# Patient Record
Sex: Male | Born: 1985 | Hispanic: No | Marital: Single | Smoking: Never smoker
Health system: Southern US, Community
[De-identification: ages and names within clinical notes are randomized; demographics above are authoritative.]

## PROBLEM LIST (undated history)

## (undated) DIAGNOSIS — F988 Other specified behavioral and emotional disorders with onset usually occurring in childhood and adolescence: Secondary | ICD-10-CM

---

## 2012-04-08 ENCOUNTER — Encounter: Payer: Self-pay | Admitting: Physician Assistant

## 2012-04-08 ENCOUNTER — Ambulatory Visit (INDEPENDENT_AMBULATORY_CARE_PROVIDER_SITE_OTHER): Payer: BC Managed Care – PPO | Admitting: Physician Assistant

## 2012-04-08 VITALS — BP 132/90 | HR 90 | Ht 73.0 in | Wt 185.0 lb

## 2012-04-08 DIAGNOSIS — R030 Elevated blood-pressure reading, without diagnosis of hypertension: Secondary | ICD-10-CM

## 2012-04-08 DIAGNOSIS — F909 Attention-deficit hyperactivity disorder, unspecified type: Secondary | ICD-10-CM | POA: Insufficient documentation

## 2012-04-08 DIAGNOSIS — Z13220 Encounter for screening for lipoid disorders: Secondary | ICD-10-CM

## 2012-04-08 DIAGNOSIS — Z131 Encounter for screening for diabetes mellitus: Secondary | ICD-10-CM

## 2012-04-08 MED ORDER — AMPHETAMINE-DEXTROAMPHET ER 20 MG PO CP24: 20.0000 mg | ORAL_CAPSULE | ORAL | Status: DC

## 2012-04-08 NOTE — Patient Instructions (Signed)
Start Adderall XR daily in the morning. Call if any CP, heart palpitations or cardiac problems. May decrease appetite make sure you continue to have a balanced diet. Follow up in 1 month. Will get labs to check cholesterol and kidneys.    Attention Deficit Hyperactivity Disorder Attention deficit hyperactivity disorder (ADHD) is a problem with behavior issues based on the way the brain functions (neurobehavioral disorder). It is a common reason for behavior and academic problems in school. CAUSES  The cause of ADHD is unknown in most cases. It may run in families. It sometimes can be associated with learning disabilities and other behavioral problems. SYMPTOMS  There are 3 types of ADHD. The 3 types and some of the symptoms include:  Inattentive   Gets bored or distracted easily.   Loses or forgets things. Forgets to hand in homework.   Has trouble organizing or completing tasks.   Difficulty staying on task.   An inability to organize daily tasks and school work.   Leaving projects, chores, or homework unfinished.   Trouble paying attention or responding to details. Careless mistakes.   Difficulty following directions. Often seems like is not listening.   Dislikes activities that require sustained attention (like chores or homework).   Hyperactive-impulsive   Feels like it is impossible to sit still or stay in a seat. Fidgeting with hands and feet.   Trouble waiting turn.   Talking too much or out of turn. Interruptive.   Speaks or acts impulsively.   Aggressive, disruptive behavior.   Constantly busy or on the go, noisy.   Combined   Has symptoms of both of the above.  Often children with ADHD feel discouraged about themselves and with school. They often perform well below their abilities in school. These symptoms can cause problems in home, school, and in relationships with peers. As children get older, the excess motor activities can calm down, but the problems  with paying attention and staying organized persist. Most children do not outgrow ADHD but with good treatment can learn to cope with the symptoms. DIAGNOSIS  When ADHD is suspected, the diagnosis should be made by professionals trained in ADHD.  Diagnosis will include:  Ruling out other reasons for the child's behavior.   The caregivers will check with the child's school and check their medical records.   They will talk to teachers and parents.   Behavior rating scales for the child will be filled out by those dealing with the child on a daily basis.  A diagnosis is made only after all information has been considered. TREATMENT  Treatment usually includes behavioral treatment often along with medicines. It may include stimulant medicines. The stimulant medicines decrease impulsivity and hyperactivity and increase attention. Other medicines used include antidepressants and certain blood pressure medicines. Most experts agree that treatment for ADHD should address all aspects of the child's functioning. Treatment should not be limited to the use of medicines alone. Treatment should include structured classroom management. The parents must receive education to address rewarding good behavior, discipline, and limit-setting. Tutoring or behavioral therapy or both should be available for the child. If untreated, the disorder can have long-term serious effects into adolescence and adulthood. HOME CARE INSTRUCTIONS   Often with ADHD there is a lot of frustration among the family in dealing with the illness. There is often blame and anger that is not warranted. This is a life long illness. There is no way to prevent ADHD. In many cases, because the problem affects  the family as a whole, the entire family may need help. A therapist can help the family find better ways to handle the disruptive behaviors and promote change. If the child is young, most of the therapist's work is with the parents. Parents will  learn techniques for coping with and improving their child's behavior. Sometimes only the child with the ADHD needs counseling. Your caregivers can help you make these decisions.   Children with ADHD may need help in organizing. Some helpful tips include:   Keep routines the same every day from wake-up time to bedtime. Schedule everything. This includes homework and playtime. This should include outdoor and indoor recreation. Keep the schedule on the refrigerator or a bulletin board where it is frequently seen. Mark schedule changes as far in advance as possible.   Have a place for everything and keep everything in its place. This includes clothing, backpacks, and school supplies.   Encourage writing down assignments and bringing home needed books.   Offer your child a well-balanced diet. Breakfast is especially important for school performance. Children should avoid drinks with caffeine including:   Soft drinks.   Coffee.   Tea.   However, some older children (adolescents) may find these drinks helpful in improving their attention.   Children with ADHD need consistent rules that they can understand and follow. If rules are followed, give small rewards. Children with ADHD often receive, and expect, criticism. Look for good behavior and praise it. Set realistic goals. Give clear instructions. Look for activities that can foster success and self-esteem. Make time for pleasant activities with your child. Give lots of affection.   Parents are their children's greatest advocates. Learn as much as possible about ADHD. This helps you become a stronger and better advocate for your child. It also helps you educate your child's teachers and instructors if they feel inadequate in these areas. Parent support groups are often helpful. A national group with local chapters is called CHADD (Children and Adults with Attention Deficit Hyperactivity Disorder).  PROGNOSIS  There is no cure for ADHD. Children  with the disorder seldom outgrow it. Many find adaptive ways to accommodate the ADHD as they mature. SEEK MEDICAL CARE IF:  Your child has repeated muscle twitches, cough or speech outbursts.   Your child has sleep problems.   Your child has a marked loss of appetite.   Your child develops depression.   Your child has new or worsening behavioral problems.   Your child develops dizziness.   Your child has a racing heart.   Your child has stomach pains.   Your child develops headaches.  Document Released: 10/18/2002 Document Revised: 10/17/2011 Document Reviewed: 05/30/2008 [facility] Patient Information 2012 [place], [place].

## 2012-04-08 NOTE — Progress Notes (Signed)
  Subjective:    Patient ID: Donald Bonilla, male    DOB: [birth year], 26 y.o.   MRN: [id number]  HPI Patient is a new patient wanting to establish care today. PMH was reviewed. Patient has had a history of ADHD. He brings in childhood records with proof of diagnosis. He has tried to manage his symptoms while in college without medication. He has done ok with this but recently his lack of focus has become unmanagable. He states that it is hard for him to focus more than at a time. He currently has to hold down a job and going to school. In the past he had been able to run an hour or more daily. Because of job and school he has not been able to run as much and thinks that might be making it worse. He denies CP, palpitations, SOB, depression. He has never had elevated bp before. He does report to be anxious today about being at the doctor.      Review of Systems     Objective:   Physical Exam  Constitutional: He is oriented to person, place, and time. He appears well-developed and well-nourished.  HENT:  Head: Normocephalic and atraumatic.  Cardiovascular: Normal rate, regular rhythm, normal heart sounds and intact distal pulses.   Pulmonary/Chest: Effort normal and breath sounds normal. He has no wheezes.  Neurological: He is alert and oriented to person, place, and time.  Skin: Skin is warm and dry.  Psychiatric:       Pacing back and forth when I walked into the room. Hyperverbal throughout encounter. Seems to be very anxious today.          Assessment & Plan:  ADHD- inattentive 27/27 hyperactivity/impulse 27/27 Adderall XR 20mg  was started daily. Educated patient about side effects and told to not tell people that he takes this drug. Call office if starts to have any side effects such as palpitations. Will follow up in 1 month.   Elevated blood pressure- has not been to doctor in many years and does not know of any prior blood pressure problems. Recheck and was 132/90. Patient  reports bad diet full of salt. He used to exercise daily with running since starting a job and school he has not been exercising very regularly. He does report that is going to start making better diet choices and limit salty foods. He also wants to try to exercise on more of a regular basis. Will recheck in 1 month.   Ordered labs today for screening purposes and to check kidney function due to bp being elevated.

## 2012-05-08 ENCOUNTER — Ambulatory Visit: Payer: BC Managed Care – PPO | Admitting: Physician Assistant

## 2013-08-23 ENCOUNTER — Ambulatory Visit: Payer: BC Managed Care – PPO | Admitting: Physician Assistant

## 2013-08-27 ENCOUNTER — Encounter: Payer: Self-pay | Admitting: Physician Assistant

## 2013-08-27 ENCOUNTER — Ambulatory Visit (INDEPENDENT_AMBULATORY_CARE_PROVIDER_SITE_OTHER): Payer: BC Managed Care – PPO | Admitting: Physician Assistant

## 2013-08-27 VITALS — BP 138/85 | HR 90 | Wt 172.0 lb

## 2013-08-27 DIAGNOSIS — F39 Unspecified mood [affective] disorder: Secondary | ICD-10-CM

## 2013-08-27 DIAGNOSIS — F411 Generalized anxiety disorder: Secondary | ICD-10-CM

## 2013-08-27 DIAGNOSIS — Z13220 Encounter for screening for lipoid disorders: Secondary | ICD-10-CM

## 2013-08-27 DIAGNOSIS — F329 Major depressive disorder, single episode, unspecified: Secondary | ICD-10-CM

## 2013-08-27 DIAGNOSIS — Z131 Encounter for screening for diabetes mellitus: Secondary | ICD-10-CM

## 2013-08-27 MED ORDER — FLUOXETINE HCL 20 MG PO TABS: 20.0000 mg | ORAL_TABLET | Freq: Every day | ORAL | Status: DC

## 2013-08-27 NOTE — Progress Notes (Signed)
  Subjective:    Patient ID: Donald Bonilla, male    DOB: 28-Aug-1986, 28 y.o.   MRN: [id number]  HPI Patient presents to the clinic to discuss ongoing anxiety and depression. He has previously been diagnosed with ADHD. He stopped medication because he felt like it was making his heart race and maybe making anxiety symptoms worse. He feels like his anxiety and sometimes depression is starting to become a problem. He recently broke up with his girlfriend and feels like it was a result of his up and down mood. His parents do not think he has a problem and do not want him to start medication. There is no diagnosed mental disorders in his family but he feels like his dad has similar problems just don't want to admit it. Situationly he has everything anyone would want money, cars, good job. He is still very unhappy. He denies any thoughts of harming himself or others. He does "live on the edge" sometimes driving 409 mph, feeling invincible then can go down and feel like a failure. He just lives day to day feeling 'overwhelmed', but he has nothing to feel overwhelmed about. He is doing well at work. He admits to spending a lot of money but he has it. Has problem going to sleep but then can sleep for 12 hours straight No pleasure in doing things. Working out does make symptoms better. He has never tried medications.    Review of Systems     Objective:   Physical Exam  Constitutional: He is oriented to person, place, and time. He appears well-developed and well-nourished.  HENT:  Head: Normocephalic and atraumatic.  Cardiovascular: Normal rate, regular rhythm and normal heart sounds.   Pulmonary/Chest: Effort normal and breath sounds normal.  Neurological: He is alert and oriented to person, place, and time.  Skin: Skin is warm and dry.  Psychiatric: Judgment and thought content normal.  Hyperverbal/anxious.          Assessment & Plan:  Mood disorder/depression/anxiety- GAD-7 was 20 and PHQ-9 was 17.  Discussed with pt he is showing a lot of signs of biploar disorder. I would like to start depakote; however, pt had never tried medication and had done research and wanted to start with SSRI. He also does not want to gain weight. I told him we could start with that but he is most likely going to need a mood stablizer. I offered a pysch referral. Pt declined and requested that he stay with me right now because he felt comfortable with me. I said that I could try a few things but he may need to talk with specialist at some point. Encouraged exercise regularly. Started prozac 20mg  daily. Likely will need to increase. Follow up in 6 weeks. Will check thyroid just to make sure not elevated.  Will get some screening labs.    Spent 30 minutes with patient and greater than 50 percent of visit spent counseling patient reguarding bipolar, depression, anxiety.

## 2013-08-27 NOTE — Patient Instructions (Signed)
Melatonin up to 10mg  at night. 1 hour before bedtime.

## 2013-08-28 LAB — LIPID PANEL
Cholesterol: 208 mg/dL — ABNORMAL HIGH (ref 0–200)
Triglycerides: 39 mg/dL (ref <150)
VLDL: 8 mg/dL (ref 0–40)

## 2013-08-28 LAB — TSH: TSH: 1.405 u[IU]/mL (ref 0.350–4.500)

## 2013-08-28 LAB — COMPLETE METABOLIC PANEL WITH GFR
AST: 18 U/L (ref 0–37)
Albumin: 4.9 g/dL (ref 3.5–5.2)
BUN: 11 mg/dL (ref 6–23)
Calcium: 9.7 mg/dL (ref 8.4–10.5)
Chloride: 102 mEq/L (ref 96–112)
Creat: 0.92 mg/dL (ref 0.50–1.35)
GFR, Est African American: >89 mL/min
GFR, Est Non African American: >89 mL/min
Glucose, Bld: 84 mg/dL (ref 70–99)
Potassium: 4.5 mEq/L (ref 3.5–5.3)

## 2013-08-29 DIAGNOSIS — F411 Generalized anxiety disorder: Secondary | ICD-10-CM | POA: Insufficient documentation

## 2013-08-29 DIAGNOSIS — F39 Unspecified mood [affective] disorder: Secondary | ICD-10-CM | POA: Insufficient documentation

## 2013-08-29 DIAGNOSIS — F329 Major depressive disorder, single episode, unspecified: Secondary | ICD-10-CM | POA: Insufficient documentation

## 2013-08-29 DIAGNOSIS — Z13220 Encounter for screening for lipoid disorders: Secondary | ICD-10-CM | POA: Insufficient documentation

## 2013-08-29 DIAGNOSIS — F32A Depression, unspecified: Secondary | ICD-10-CM | POA: Insufficient documentation

## 2013-10-11 ENCOUNTER — Ambulatory Visit: Payer: BC Managed Care – PPO | Admitting: Physician Assistant

## 2013-10-11 DIAGNOSIS — Z0289 Encounter for other administrative examinations: Secondary | ICD-10-CM

## 2018-07-22 ENCOUNTER — Emergency Department (HOSPITAL_COMMUNITY)
Admission: EM | Admit: 2018-07-22 | Discharge: 2018-07-22 | Disposition: A | Discharge status: Home / Self Care | Payer: PRIVATE HEALTH INSURANCE | Attending: Emergency Medicine | Admitting: Emergency Medicine

## 2018-07-22 ENCOUNTER — Emergency Department (HOSPITAL_COMMUNITY): Payer: PRIVATE HEALTH INSURANCE

## 2018-07-22 ENCOUNTER — Encounter (HOSPITAL_COMMUNITY): Payer: Self-pay

## 2018-07-22 ENCOUNTER — Other Ambulatory Visit: Payer: Self-pay

## 2018-07-22 DIAGNOSIS — Q438 Other specified congenital malformations of intestine: Secondary | ICD-10-CM | POA: Diagnosis not present

## 2018-07-22 DIAGNOSIS — R1032 Left lower quadrant pain: Secondary | ICD-10-CM | POA: Diagnosis present

## 2018-07-22 DIAGNOSIS — K6389 Other specified diseases of intestine: Secondary | ICD-10-CM

## 2018-07-22 DIAGNOSIS — K529 Noninfective gastroenteritis and colitis, unspecified: Secondary | ICD-10-CM

## 2018-07-22 LAB — CBC
HCT: 45.9 % (ref 39.0–52.0)
Hemoglobin: 15.4 g/dL (ref 13.0–17.0)
MCH: 29.6 pg (ref 26.0–34.0)
MCHC: 33.6 g/dL (ref 30.0–36.0)
MCV: 88.3 fL (ref 78.0–100.0)
Platelets: 266 10*3/uL (ref 150–400)
RBC: 5.2 MIL/uL (ref 4.22–5.81)
RDW: 12.7 % (ref 11.5–15.5)
WBC: 9 10*3/uL (ref 4.0–10.5)

## 2018-07-22 LAB — COMPREHENSIVE METABOLIC PANEL
ALT: 33 U/L (ref 0–44)
AST: 24 U/L (ref 15–41)
Albumin: 4.5 g/dL (ref 3.5–5.0)
Alkaline Phosphatase: 66 U/L (ref 38–126)
Anion gap: 9 (ref 5–15)
BUN: 17 mg/dL (ref 6–20)
CO2: 28 mmol/L (ref 22–32)
Calcium: 9.7 mg/dL (ref 8.9–10.3)
Chloride: 103 mmol/L (ref 98–111)
Creatinine, Ser: 0.92 mg/dL (ref 0.61–1.24)
GFR calc Af Amer: >60 mL/min (ref >60)
GFR calc non Af Amer: >60 mL/min (ref >60)
Glucose, Bld: 101 mg/dL — ABNORMAL HIGH (ref 70–99)
Potassium: 4.3 mmol/L (ref 3.5–5.1)
Sodium: 140 mmol/L (ref 135–145)
Total Bilirubin: 0.5 mg/dL (ref 0.3–1.2)
Total Protein: 8.1 g/dL (ref 6.5–8.1)

## 2018-07-22 LAB — URINALYSIS, ROUTINE W REFLEX MICROSCOPIC
Bilirubin Urine: NEGATIVE
Glucose, UA: NEGATIVE mg/dL
Hgb urine dipstick: NEGATIVE
Ketones, ur: NEGATIVE mg/dL
Leukocytes, UA: NEGATIVE
Nitrite: NEGATIVE
Protein, ur: NEGATIVE mg/dL
Specific Gravity, Urine: 1.009 (ref 1.005–1.030)
pH: 6 (ref 5.0–8.0)

## 2018-07-22 LAB — LIPASE, BLOOD: Lipase: 52 U/L — ABNORMAL HIGH (ref 11–51)

## 2018-07-22 MED ORDER — IOPAMIDOL (ISOVUE-300) INJECTION 61%
INTRAVENOUS | Status: AC
  Filled 2018-07-22: qty 100

## 2018-07-22 MED ORDER — LACTATED RINGERS IV BOLUS
1000.0000 mL | Freq: Once | INTRAVENOUS | Status: AC
  Administered 2018-07-22: 1000 mL via INTRAVENOUS

## 2018-07-22 MED ORDER — IOPAMIDOL (ISOVUE-300) INJECTION 61%
100.0000 mL | Freq: Once | INTRAVENOUS | Status: AC | PRN
  Administered 2018-07-22: 100 mL via INTRAVENOUS

## 2018-07-22 NOTE — ED Provider Notes (Signed)
[facility]-EMERGENCY DEPT Provider Note   CSN: [id number] Arrival date & time: 07/22/18  1027     History   Chief Complaint Chief Complaint  Patient presents with  . Abdominal Pain    HPI Donald Bonilla is a 32 y.o. male.  HPI   32 year old male with abdominal pain.  Gradual onset about 3 days ago.  Pain has been persistently in the left lower quadrant and doesn't radiate.  Initially mild then intensified over about 24 hours and has been relatively stable since then.  Worse with certain movements.  Has a bloated sensation.  Mild nausea.  No vomiting.  No change in his bowel movements.  No urinary complaints.  No history of similar symptoms.  He has had kidney stones previously but states that this feels different.  Denies any past history of any abdominal surgeries.  Past Medical History:  Diagnosis Date  . Asthma    childhood    Patient Active Problem List   Diagnosis Date Noted  . Generalized anxiety disorder 08/29/2013  . Screening for lipoid disorders 08/29/2013  . Depression 08/29/2013  . Mood disorder (HCC) 08/29/2013  . ADHD (attention deficit hyperactivity disorder) 04/08/2012    History reviewed. No pertinent surgical history.      Home Medications    Prior to Admission medications   Medication Sig Start Date End Date Taking? Authorizing Provider  amphetamine-dextroamphetamine (ADDERALL) 20 MG tablet Take 20 mg by mouth 2 (two) times daily. 07/20/18  Yes [provider]  zaleplon (SONATA) 10 MG capsule Take 10 mg by mouth at bedtime as needed for sleep.  04/17/18  Yes [provider]    Family History Family History  Problem Relation Age of Onset  . Cancer Mother        skin cancer  . Hypertension Maternal Grandmother   . Cancer Maternal Grandmother        breast cancer  . Hyperlipidemia Maternal Grandmother   . Diabetes Maternal Grandfather   . Stroke Paternal Grandfather   . Alcohol abuse Paternal Grandfather       Social History Social History   Tobacco Use  . Smoking status: Never Smoker  Substance Use Topics  . Alcohol use: Yes    Alcohol/week: 7.0 standard drinks    Types: 7 Cans of beer per week  . Drug use: No     Allergies   Patient has no known allergies.   Review of Systems Review of Systems  All systems reviewed and negative, other than as noted in HPI.  Physical Exam Updated Vital Signs BP (!) 133/91 (BP Location: Left Arm)   Pulse 84   Temp 98.6 F (37 C) (Oral)   Resp 18   Ht 6\' 1"  (1.854 m)   Wt 86.2 kg   SpO2 100%   BMI 25.07 kg/m   Physical Exam  Constitutional: He appears well-developed and well-nourished. No distress.  HENT:  Head: Normocephalic and atraumatic.  Eyes: Conjunctivae are normal. Right eye exhibits no discharge. Left eye exhibits no discharge.  Neck: Neck supple.  Cardiovascular: Normal rate, regular rhythm and normal heart sounds. Exam reveals no gallop and no friction rub.  No murmur heard. Pulmonary/Chest: Effort normal and breath sounds normal. No respiratory distress.  Abdominal: Soft. He exhibits no distension. There is tenderness in the left lower quadrant. There is no rigidity, no rebound, no guarding and no CVA tenderness.  Musculoskeletal: He exhibits no edema or tenderness.  Neurological: He is alert.  Skin:  Skin is warm and dry.  Psychiatric: He has a normal mood and affect. His behavior is normal. Thought content normal.  Nursing note and vitals reviewed.    ED Treatments / Results  Labs (all labs ordered are listed, but only abnormal results are displayed) Labs Reviewed  LIPASE, BLOOD - Abnormal; Notable for the following components:      Result Value   Lipase 52 (*)    All other components within normal limits  COMPREHENSIVE METABOLIC PANEL - Abnormal; Notable for the following components:   Glucose, Bld 101 (*)    All other components within normal limits  URINALYSIS, ROUTINE W REFLEX MICROSCOPIC - Abnormal;  Notable for the following components:   Color, Urine STRAW (*)    All other components within normal limits  CBC    EKG None  Radiology Ct Abdomen Pelvis W Contrast  Result Date: 07/22/2018 CLINICAL DATA:  Bloating, left lower quadrant pain. EXAM: CT ABDOMEN AND PELVIS WITH CONTRAST TECHNIQUE: Multidetector CT imaging of the abdomen and pelvis was performed using the standard protocol following bolus administration of intravenous contrast. CONTRAST:  ISOVUE-300 IOPAMIDOL (ISOVUE-300) INJECTION 61% COMPARISON:  None. FINDINGS: Lower chest: Lung bases are clear. No effusions. Heart is normal size. Hepatobiliary: No focal hepatic abnormality. Gallbladder unremarkable. Pancreas: No focal abnormality or ductal dilatation. Spleen: No focal abnormality.  Normal size. Adrenals/Urinary Tract: No adrenal abnormality. No focal renal abnormality. No stones or hydronephrosis. Urinary bladder is unremarkable. Stomach/Bowel: There is inflammatory stranding anterior to the proximal sigmoid colon and distal descending colon. The underlying bowel wall appears normal and I see no diverticula. I favor this represents epiploic appendagitis. Stomach, large and small bowel grossly unremarkable. Appendix is normal. Vascular/Lymphatic: No evidence of aneurysm or adenopathy. Reproductive: No visible focal abnormality. Other: No free fluid or free air. Musculoskeletal: No acute bony abnormality. IMPRESSION: Stranding noted within the fat anterior to the distal descending colon and proximal sigmoid colon without underlying wall abnormality. Favor epiploic appendagitis. Normal appendix. Electronically Signed   By: Charlett Nose M.D.   On: 07/22/2018 12:03    Procedures Procedures (including critical care time)  Medications Ordered in ED Medications  iopamidol (ISOVUE-300) 61 % injection (has no administration in time range)  lactated ringers bolus 1,000 mL (1,000 mLs Intravenous New Bag/Given 07/22/18 1120)  iopamidol  (ISOVUE-300) 61 % injection 100 mL (100 mLs Intravenous Contrast Given 07/22/18 1144)     Initial Impression / Assessment and Plan / ED Course  I have reviewed the triage vital signs and the nursing notes.  Pertinent labs & imaging results that were available during my care of the patient were reviewed by me and considered in my medical decision making (see chart for details).     32year-old male with left lower quadrant pain.  CT showing what may be epiploic appendagitis.  Labs/urinalysis fairly unremarkable.  He is afebrile.  No peritonitis.  Plan symptomatic treatment and expectant management.  This should be a self-limited process.  Return precautions were discussed though.  As needed NSAIDs.  I have reviewed the triage vital signs and the nursing notes. Prior records were reviewed for additional information.    Pertinent labs & imaging results that were available during my care of the patient were reviewed by me and considered in my medical decision making (see chart for details).   Final Clinical Impressions(s) / ED Diagnoses   Final diagnoses:  Epiploic appendagitis    ED Discharge Orders    None  Raeford Razor, MD 07/22/18 1230

## 2018-07-22 NOTE — ED Triage Notes (Signed)
Pt states that he has been bloated and having LLQ pain for several days. Pt states he also became nauseated today.

## 2018-07-22 NOTE — Discharge Instructions (Addendum)
Based on your CT scan, you likely have epiploic appendagitis. Epiploic appendices are finger like fat filled sacs primarily along your colon. They can twist or thrombose (clot) and when this happens it can hurt. It is generally not a serious condition and symptoms usually resolve within a few days up to a couple weeks. Take ibuprofen 600 mg every 6 hours as needed for pain. Return for evaluation for worsening pain, fever, vomiting or if you symptoms do not begin to feel better after about a week.

## 2020-01-29 IMAGING — CT CT ABD-PELV W/ CM
2 of 4 series · 16 of 46 positions shown, 18 images · IV contrast (ISOVUE)
Comparison: None.

CLINICAL DATA: Bloating, left lower quadrant pain.

EXAM:
CT ABDOMEN AND PELVIS WITH CONTRAST
TECHNIQUE: Multidetector CT imaging of the abdomen and pelvis was performed
using the standard protocol following bolus administration of
intravenous contrast.
CONTRAST:  100mL CA4AEK-1YY IOPAMIDOL (CA4AEK-1YY) INJECTION 61%

[Series 2: axial st · axial · 0.81mm/px · z∈[-562,-107]mm · 13 of 103 slices shown, 15 images]
[im 6/103  soft-tissue]
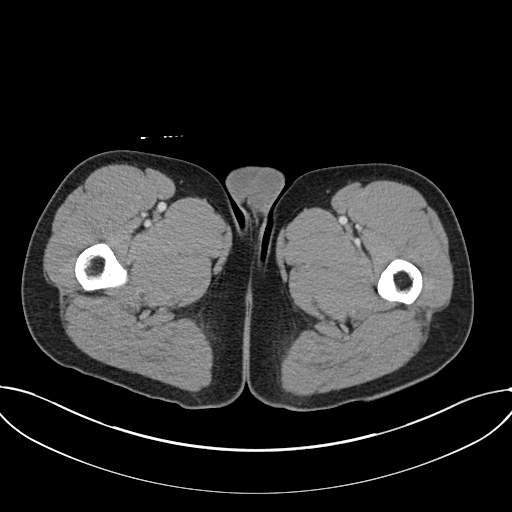
[im 6/103  bone]
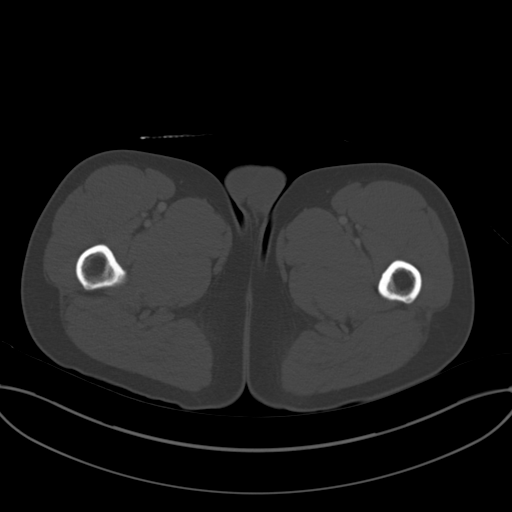
[im 16/103  soft-tissue]
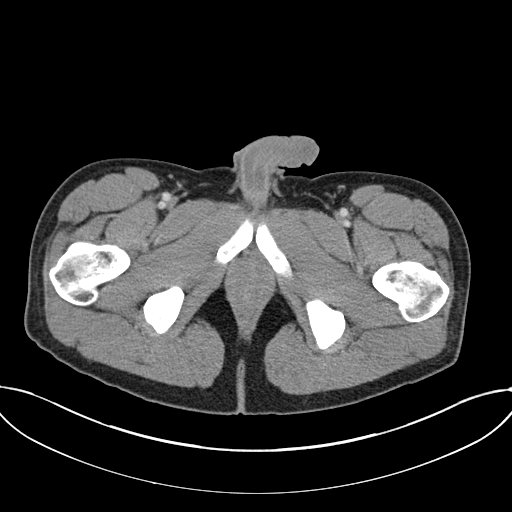
[im 21/103  soft-tissue]
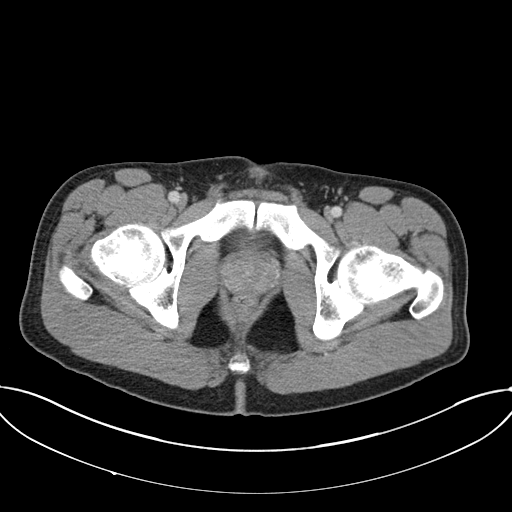
[im 31/103  soft-tissue]
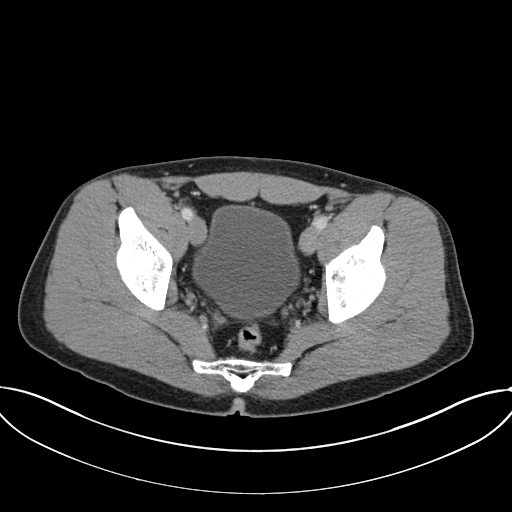
[im 36/103  soft-tissue]
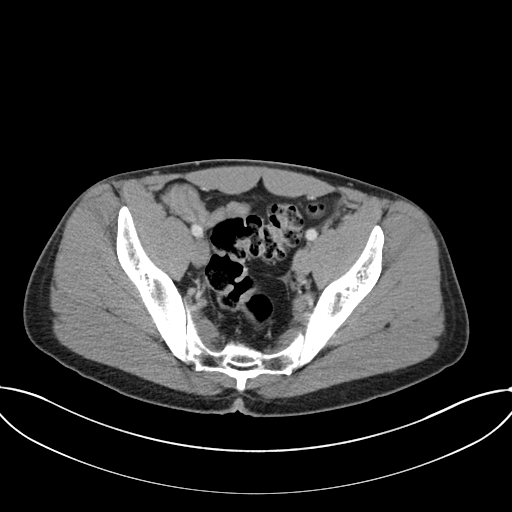
[im 46/103  soft-tissue]
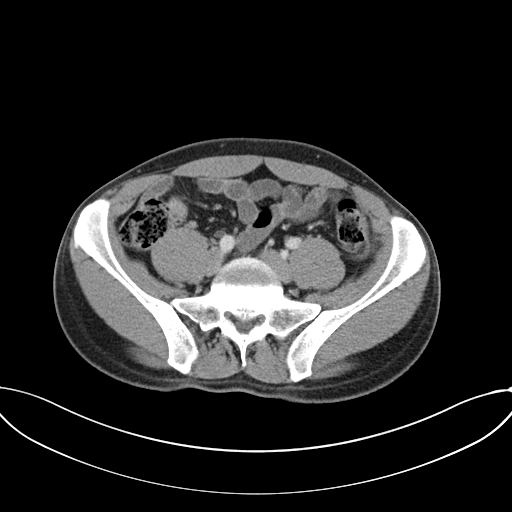
[im 52/103  soft-tissue]
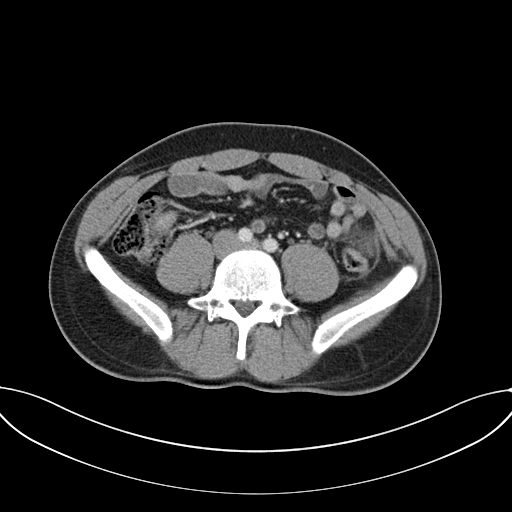
[im 57/103  soft-tissue]
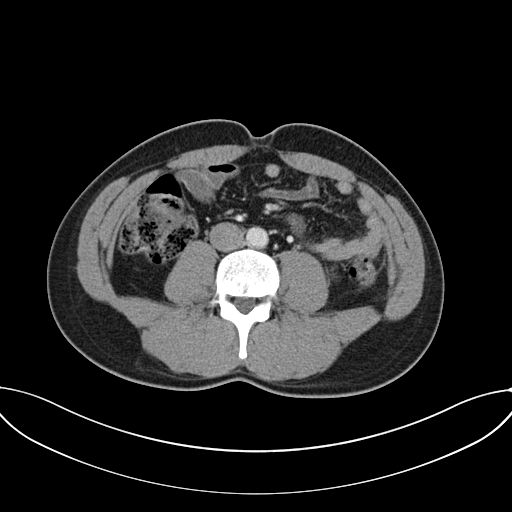
[im 67/103  soft-tissue]
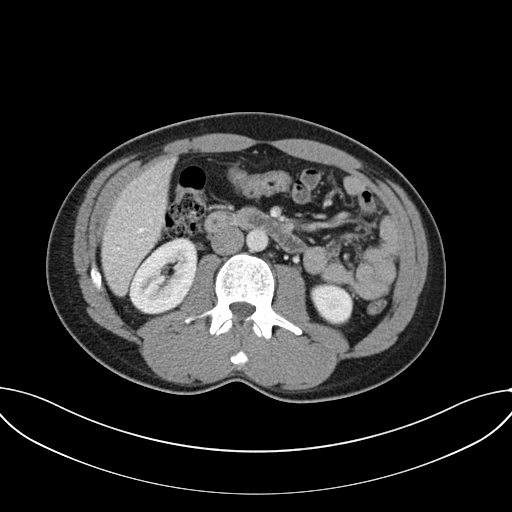
[im 67/103  bone]
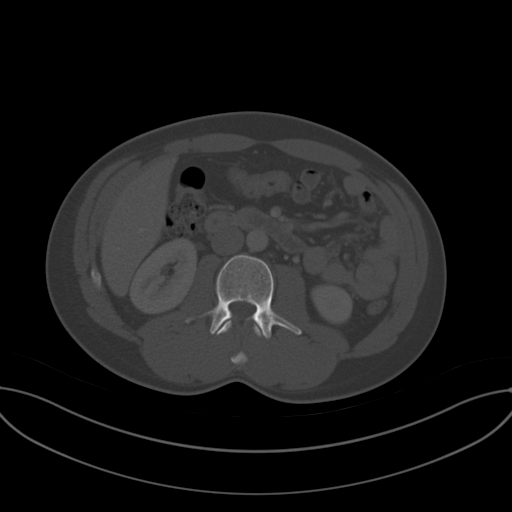
[im 72/103  soft-tissue]
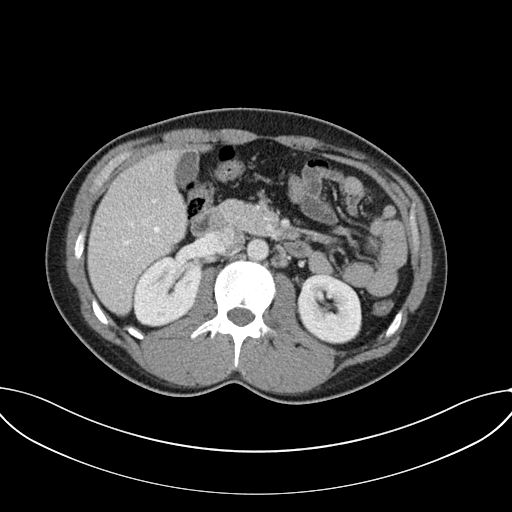
[im 82/103  soft-tissue]
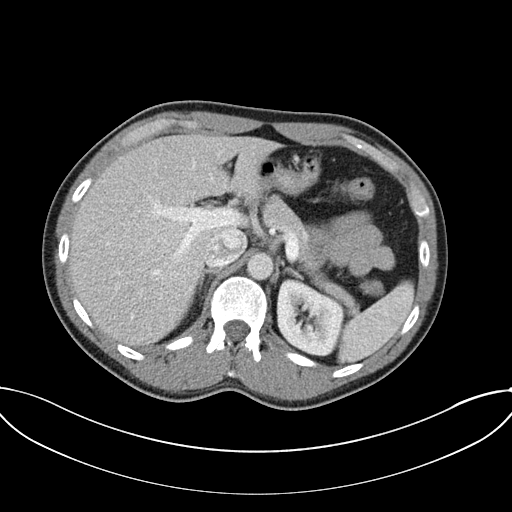
[im 87/103  soft-tissue]
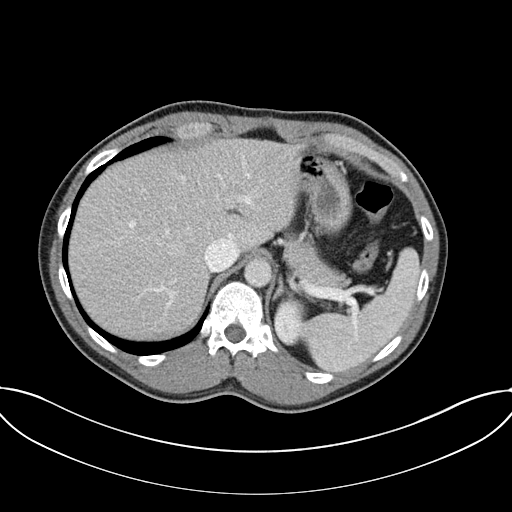
[im 97/103  soft-tissue]
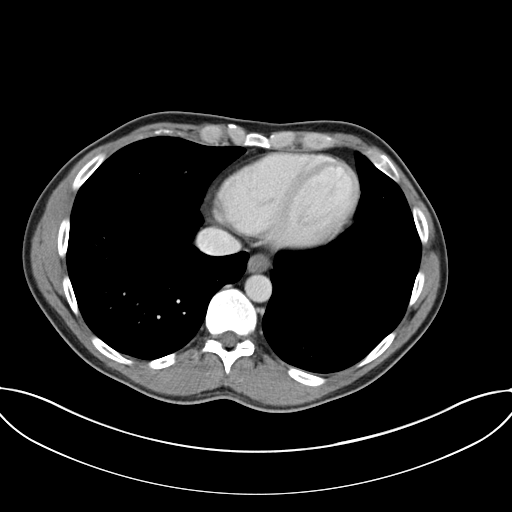

[Series 5: coronal st · coronal · 0.74mm/px · 3 of 88 slices shown]
[im 30/88  soft-tissue]
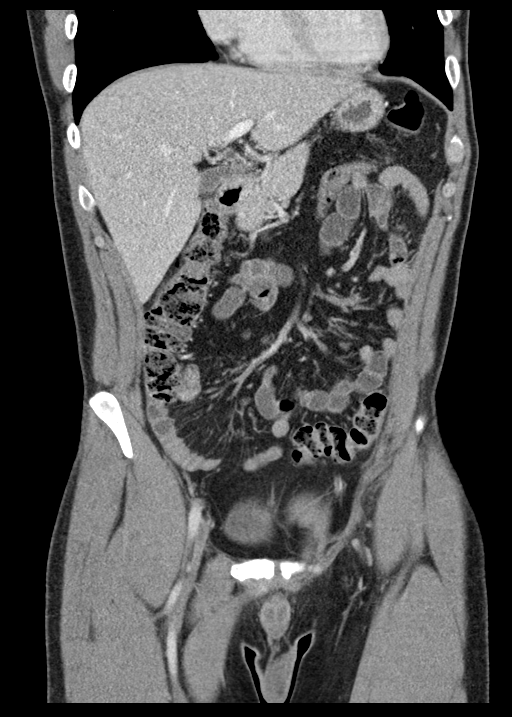
[im 39/88  soft-tissue]
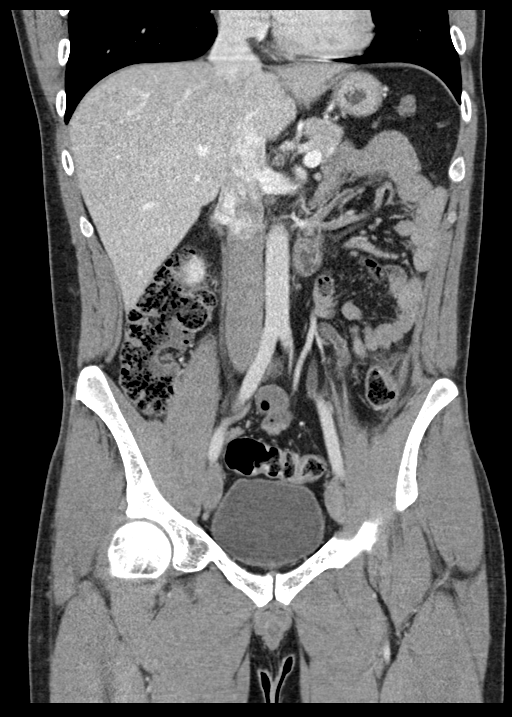
[im 49/88  soft-tissue]
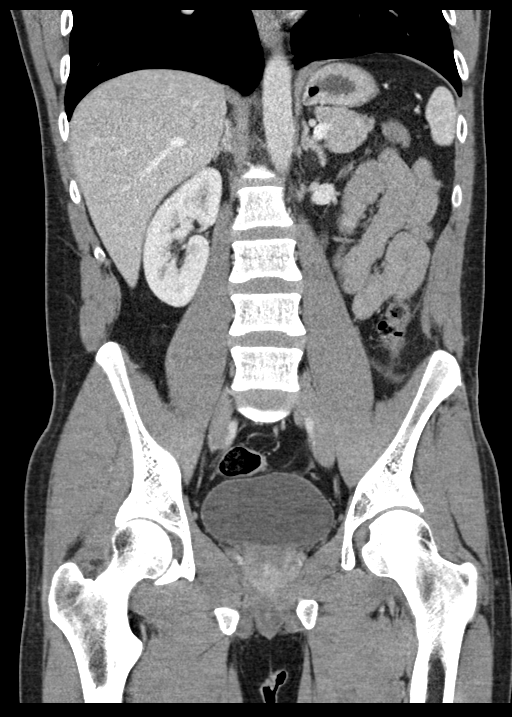

[16 of 46 positions shown; findings below may reference images not displayed]

FINDINGS: Lower chest: Lung bases are clear. No effusions. Heart is normal
size.

Hepatobiliary: No focal hepatic abnormality. Gallbladder
unremarkable.

Pancreas: No focal abnormality or ductal dilatation.

Spleen: No focal abnormality.  Normal size.

Adrenals/Urinary Tract: No adrenal abnormality. No focal renal
abnormality. No stones or hydronephrosis. Urinary bladder is
unremarkable.

Stomach/Bowel: There is inflammatory stranding anterior to the
proximal sigmoid colon and distal descending colon. The underlying
bowel wall appears normal and I see no diverticula. I favor this
represents epiploic appendagitis. Stomach, large and small bowel
grossly unremarkable. Appendix is normal.

Vascular/Lymphatic: No evidence of aneurysm or adenopathy.

Reproductive: No visible focal abnormality.

Other: No free fluid or free air.

Musculoskeletal: No acute bony abnormality.
IMPRESSION: Stranding noted within the fat anterior to the distal descending
colon and proximal sigmoid colon without underlying wall
abnormality. Favor epiploic appendagitis.

Normal appendix.

## 2020-07-15 ENCOUNTER — Emergency Department
Admission: EM | Admit: 2020-07-15 | Discharge: 2020-07-15 | Disposition: A | Discharge status: Home / Self Care | Payer: BLUE CROSS/BLUE SHIELD | Source: Home / Self Care

## 2020-07-15 ENCOUNTER — Encounter: Payer: Self-pay | Admitting: Emergency Medicine

## 2020-07-15 ENCOUNTER — Other Ambulatory Visit: Payer: Self-pay

## 2020-07-15 DIAGNOSIS — R52 Pain, unspecified: Secondary | ICD-10-CM

## 2020-07-15 DIAGNOSIS — R6883 Chills (without fever): Secondary | ICD-10-CM

## 2020-07-15 HISTORY — DX: Other specified behavioral and emotional disorders with onset usually occurring in childhood and adolescence: F98.8

## 2020-07-15 NOTE — Discharge Instructions (Signed)
  You may take 500mg acetaminophen every 4-6 hours or in combination with ibuprofen 400-600mg every 6-8 hours as needed for pain, inflammation, and fever.  Be sure to well hydrated with clear liquids and get at least 8 hours of sleep at night, preferably more while sick.   Please follow up with family medicine in 1 week if needed.   

## 2020-07-15 NOTE — ED Provider Notes (Signed)
[facility]    CSN: [id number] Arrival date & time: 07/15/20  1200      History   Chief Complaint Chief Complaint  Patient presents with  . Chills  . Generalized Body Aches    HPI Donald Bonilla is a 34 y.o. male.   HPI  Donald Bonilla is a 34 y.o. male presenting to UC with c/o generalized body aches and chills with mild headache since receiving his second Pfizer COVID vaccine 5 days ago.  He has not taken anything for his symptoms. His boss recommended he be tested for COVID due to the prolonged symptoms. Denies known sick contacts. Pt works as a Investment banker, operational.  Denies fever, n/v/d. No chest pain or SOB. No cough or congestion.    Past Medical History:  Diagnosis Date  . ADD (attention deficit disorder)   . Asthma    childhood    Patient Active Problem List   Diagnosis Date Noted  . Generalized anxiety disorder 08/29/2013  . Screening for lipoid disorders 08/29/2013  . Depression 08/29/2013  . Mood disorder (HCC) 08/29/2013  . ADHD (attention deficit hyperactivity disorder) 04/08/2012    History reviewed. No pertinent surgical history.     Home Medications    Prior to Admission medications   Medication Sig Start Date End Date Taking? Authorizing Provider  amphetamine-dextroamphetamine (ADDERALL) 20 MG tablet Take 20 mg by mouth 2 (two) times daily. 07/20/18   [provider]  zaleplon (SONATA) 10 MG capsule Take 10 mg by mouth at bedtime as needed for sleep.  04/17/18   [provider]    Family History Family History  Problem Relation Age of Onset  . Cancer Mother        skin cancer  . Hypertension Maternal Grandmother   . Cancer Maternal Grandmother        breast cancer  . Hyperlipidemia Maternal Grandmother   . Diabetes Maternal Grandfather   . Stroke Paternal Grandfather   . Alcohol abuse Paternal Grandfather     Social History Social History   Tobacco Use  . Smoking status: Never Smoker  Substance Use Topics  . Alcohol use:  Yes    Alcohol/week: 7.0 standard drinks    Types: 7 Cans of beer per week  . Drug use: No     Allergies   Patient has no known allergies.   Review of Systems Review of Systems  Constitutional: Positive for chills. Negative for fever.  HENT: Negative for congestion, ear pain, sore throat, trouble swallowing and voice change.   Respiratory: Negative for cough and shortness of breath.   Cardiovascular: Negative for chest pain and palpitations.  Gastrointestinal: Negative for abdominal pain, diarrhea, nausea and vomiting.  Musculoskeletal: Positive for arthralgias, back pain and myalgias.  Skin: Negative for rash.  Neurological: Positive for headaches. Negative for dizziness and light-headedness.  All other systems reviewed and are negative.    Physical Exam Triage Vital Signs ED Triage Vitals  Enc Vitals Group     BP 07/15/20 1307 118/78     Pulse Rate 07/15/20 1307 88     Resp 07/15/20 1307 16     Temp 07/15/20 1307 97.9 F (36.6 C)     Temp Source 07/15/20 1307 Oral     SpO2 07/15/20 1307 98 %     Weight 07/15/20 1308 190 lb (86.2 kg)     Height 07/15/20 1308 6\' 1"  (1.854 m)     Head Circumference --      Peak Flow --  Pain Score 07/15/20 1308 2     Pain Loc --      Pain Edu? --      Excl. in GC? --    No data found.  Updated Vital Signs BP 118/78 (BP Location: Right Arm)   Pulse 88   Temp 97.9 F (36.6 C) (Oral)   Resp 16   Ht 6\' 1"  (1.854 m)   Wt 190 lb (86.2 kg)   SpO2 98%   BMI 25.07 kg/m   Visual Acuity Right Eye Distance:   Left Eye Distance:   Bilateral Distance:    Right Eye Near:   Left Eye Near:    Bilateral Near:     Physical Exam Vitals and nursing note reviewed.  Constitutional:      General: He is not in acute distress.    Appearance: Normal appearance. He is well-developed. He is not ill-appearing, toxic-appearing or diaphoretic.  HENT:     Head: Normocephalic and atraumatic.     Right Ear: Tympanic membrane and ear canal  normal.     Left Ear: Tympanic membrane and ear canal normal.     Nose: Nose normal.     Right Sinus: No maxillary sinus tenderness or frontal sinus tenderness.     Left Sinus: No maxillary sinus tenderness or frontal sinus tenderness.     Mouth/Throat:     Lips: Pink.     Mouth: Mucous membranes are moist.     Pharynx: Oropharynx is clear. Uvula midline.  Eyes:     Extraocular Movements: Extraocular movements intact.     Conjunctiva/sclera: Conjunctivae normal.  Cardiovascular:     Rate and Rhythm: Normal rate and regular rhythm.  Pulmonary:     Effort: Pulmonary effort is normal. No respiratory distress.     Breath sounds: Normal breath sounds. No stridor. No wheezing, rhonchi or rales.  Musculoskeletal:        General: Normal range of motion.     Cervical back: Normal range of motion and neck supple. No tenderness.  Lymphadenopathy:     Cervical: No cervical adenopathy.  Skin:    General: Skin is warm and dry.     Capillary Refill: Capillary refill takes less than 2 seconds.  Neurological:     General: No focal deficit present.     Mental Status: He is alert and oriented to person, place, and time.  Psychiatric:        Behavior: Behavior normal.      UC Treatments / Results  Labs (all labs ordered are listed, but only abnormal results are displayed) Labs Reviewed  SARS-COV-2 RNA,(COVID-19) QUALITATIVE NAAT    EKG   Radiology No results found.  Procedures Procedures (including critical care time)  Medications Ordered in UC Medications - No data to display  Initial Impression / Assessment and Plan / UC Course  I have reviewed the triage vital signs and the nursing notes.  Pertinent labs & imaging results that were available during my care of the patient were reviewed by me and considered in my medical decision making (see chart for details).     Pt appears well, NAD COVID test pending F/u with PCP later this week if needed AVS given  Final Clinical  Impressions(s) / UC Diagnoses   Final diagnoses:  Body aches  Chills     Discharge Instructions     You may take 500mg  acetaminophen every 4-6 hours or in combination with ibuprofen 400-600mg  every 6-8 hours as needed for pain, inflammation,  and fever.  Be sure to well hydrated with clear liquids and get at least 8 hours of sleep at night, preferably more while sick.   Please follow up with family medicine in 1 week if needed.     ED Prescriptions    None     PDMP not reviewed this encounter.   Lurene Shadow, [place] 07/15/20 1415

## 2020-07-15 NOTE — ED Triage Notes (Signed)
Patient here for covid test for work; he had 2nd of covid vaccination doses 07/10/20; since then he has had chills and body aches. Work does not accept this as post vaccination SEs.

## 2020-07-16 LAB — SARS-COV-2 RNA,(COVID-19) QUALITATIVE NAAT: SARS CoV2 RNA: NOT DETECTED

## 2023-11-20 ENCOUNTER — Other Ambulatory Visit (HOSPITAL_COMMUNITY): Payer: Self-pay | Admitting: Otolaryngology

## 2023-11-20 DIAGNOSIS — R221 Localized swelling, mass and lump, neck: Secondary | ICD-10-CM

## 2024-01-07 ENCOUNTER — Ambulatory Visit: Admit: 2024-01-07 | Payer: PRIVATE HEALTH INSURANCE | Admitting: Otolaryngology

## 2024-01-07 SURGERY — EXCISION MASS NECK
Anesthesia: General | Laterality: Right

## 2024-11-19 ENCOUNTER — Encounter: Payer: Self-pay | Admitting: *Deleted
